# Patient Record
Sex: Male | Born: 1995 | Race: White | Hispanic: No | Marital: Single | Smoking: Current every day smoker
Health system: Southern US, Community
[De-identification: ages and names within clinical notes are randomized; demographics above are authoritative.]

---
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
## 2017-09-18 ENCOUNTER — Encounter (HOSPITAL_COMMUNITY): Payer: Self-pay | Admitting: Emergency Medicine

## 2017-09-18 ENCOUNTER — Emergency Department (HOSPITAL_COMMUNITY)
Admission: EM | Admit: 2017-09-18 | Discharge: 2017-09-19 | Disposition: A | Discharge status: Home / Self Care | Payer: Medicaid Other | Attending: Emergency Medicine | Admitting: Emergency Medicine

## 2017-09-18 DIAGNOSIS — F17210 Nicotine dependence, cigarettes, uncomplicated: Secondary | ICD-10-CM | POA: Diagnosis not present

## 2017-09-18 DIAGNOSIS — R51 Headache: Secondary | ICD-10-CM | POA: Insufficient documentation

## 2017-09-18 DIAGNOSIS — R590 Localized enlarged lymph nodes: Secondary | ICD-10-CM | POA: Diagnosis present

## 2017-09-18 DIAGNOSIS — R519 Headache, unspecified: Secondary | ICD-10-CM

## 2017-09-18 LAB — BASIC METABOLIC PANEL
Anion gap: 7 (ref 5–15)
BUN: 8 mg/dL (ref 6–20)
CO2: 30 mmol/L (ref 22–32)
Calcium: 9.8 mg/dL (ref 8.9–10.3)
Chloride: 106 mmol/L (ref 98–111)
Creatinine, Ser: 0.89 mg/dL (ref 0.61–1.24)
GFR calc Af Amer: >60 mL/min (ref >60)
GFR calc non Af Amer: >60 mL/min (ref >60)
Glucose, Bld: 103 mg/dL — ABNORMAL HIGH (ref 70–99)
POTASSIUM: 4.6 mmol/L (ref 3.5–5.1)
Sodium: 143 mmol/L (ref 135–145)

## 2017-09-18 LAB — CBC
HCT: 48 % (ref 39.0–52.0)
HEMOGLOBIN: 15.1 g/dL (ref 13.0–17.0)
MCH: 28.3 pg (ref 26.0–34.0)
MCHC: 31.5 g/dL (ref 30.0–36.0)
MCV: 90.1 fL (ref 78.0–100.0)
Platelets: 305 10*3/uL (ref 150–400)
RBC: 5.33 MIL/uL (ref 4.22–5.81)
RDW: 12.4 % (ref 11.5–15.5)
WBC: 9.8 10*3/uL (ref 4.0–10.5)
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
## 2017-09-19 ENCOUNTER — Emergency Department (HOSPITAL_COMMUNITY): Payer: Medicaid Other

## 2017-09-19 MED ORDER — METOCLOPRAMIDE HCL 5 MG/ML IJ SOLN
10.0000 mg | Freq: Once | INTRAMUSCULAR | Status: AC
  Administered 2017-09-19: 10 mg via INTRAVENOUS
  Filled 2017-09-19: qty 2

## 2017-09-19 MED ORDER — DIPHENHYDRAMINE HCL 50 MG/ML IJ SOLN
25.0000 mg | Freq: Once | INTRAMUSCULAR | Status: AC
  Administered 2017-09-19: 25 mg via INTRAVENOUS
  Filled 2017-09-19: qty 1

## 2017-09-19 MED ORDER — KETOROLAC TROMETHAMINE 30 MG/ML IJ SOLN
30.0000 mg | Freq: Once | INTRAMUSCULAR | Status: AC
  Administered 2017-09-19: 30 mg via INTRAVENOUS
  Filled 2017-09-19: qty 1

## 2017-09-19 NOTE — ED Notes (Signed)
Pt departed in NAD, refused use of wheelchair.  

## 2017-09-19 NOTE — Discharge Instructions (Addendum)
As we discussed, your headaches are probably migrainous.  Follow-up with the neurologist.  Take ibuprofen as needed.  Return to the ED if you develop new or worsening symptoms.
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
## 2020-04-01 ENCOUNTER — Encounter (HOSPITAL_COMMUNITY): Payer: Self-pay | Admitting: Emergency Medicine

## 2020-04-01 ENCOUNTER — Emergency Department (HOSPITAL_COMMUNITY): Payer: No Typology Code available for payment source

## 2020-04-01 ENCOUNTER — Emergency Department (HOSPITAL_COMMUNITY)
Admission: EM | Admit: 2020-04-01 | Discharge: 2020-04-01 | Disposition: A | Discharge status: Home / Self Care | Payer: No Typology Code available for payment source | Attending: Emergency Medicine | Admitting: Emergency Medicine

## 2020-04-01 ENCOUNTER — Other Ambulatory Visit: Payer: Self-pay

## 2020-04-01 DIAGNOSIS — S39012A Strain of muscle, fascia and tendon of lower back, initial encounter: Secondary | ICD-10-CM

## 2020-04-01 DIAGNOSIS — F17200 Nicotine dependence, unspecified, uncomplicated: Secondary | ICD-10-CM | POA: Insufficient documentation

## 2020-04-01 DIAGNOSIS — S0101XA Laceration without foreign body of scalp, initial encounter: Secondary | ICD-10-CM | POA: Diagnosis not present

## 2020-04-01 DIAGNOSIS — M542 Cervicalgia: Secondary | ICD-10-CM | POA: Insufficient documentation

## 2020-04-01 DIAGNOSIS — S29012A Strain of muscle and tendon of back wall of thorax, initial encounter: Secondary | ICD-10-CM | POA: Insufficient documentation

## 2020-04-01 DIAGNOSIS — S299XXA Unspecified injury of thorax, initial encounter: Secondary | ICD-10-CM | POA: Diagnosis present

## 2020-04-01 DIAGNOSIS — R519 Headache, unspecified: Secondary | ICD-10-CM | POA: Diagnosis not present

## 2020-04-01 MED ORDER — HYDROCODONE-ACETAMINOPHEN 5-325 MG PO TABS: 1.0000 | ORAL_TABLET | ORAL | 0 refills | Status: AC | PRN

## 2020-04-01 MED ORDER — HYDROCODONE-ACETAMINOPHEN 5-325 MG PO TABS
1.0000 | ORAL_TABLET | Freq: Once | ORAL | Status: AC
  Administered 2020-04-01: 1 via ORAL
  Filled 2020-04-01: qty 1

## 2020-04-01 NOTE — ED Notes (Addendum)
Patient in Xray, unable to medicate or reassess vitals
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
## 2020-04-01 NOTE — ED Notes (Signed)
Pt asking for arm to be bandaged - non-stick bandage applied

## 2020-04-01 NOTE — ED Notes (Signed)
Pt asking for socks

## 2020-04-01 NOTE — ED Notes (Signed)
Pt asking for note for work

## 2020-04-01 NOTE — ED Notes (Signed)
Pt asking for something to wear home, paper scrubs provided

## 2020-04-01 NOTE — ED Notes (Signed)
Patient to CT.
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
## 2020-04-01 NOTE — Discharge Instructions (Addendum)
Have the staple removed in 10-14 days.

## 2020-07-22 ENCOUNTER — Encounter (HOSPITAL_COMMUNITY): Payer: Self-pay | Admitting: Emergency Medicine

## 2020-07-22 ENCOUNTER — Emergency Department (HOSPITAL_COMMUNITY)
Admission: EM | Admit: 2020-07-22 | Discharge: 2020-07-22 | Disposition: A | Discharge status: Home / Self Care | Payer: Self-pay | Attending: Emergency Medicine | Admitting: Emergency Medicine

## 2020-07-22 ENCOUNTER — Other Ambulatory Visit: Payer: Self-pay

## 2020-07-22 DIAGNOSIS — K0889 Other specified disorders of teeth and supporting structures: Secondary | ICD-10-CM | POA: Insufficient documentation

## 2020-07-22 DIAGNOSIS — F17200 Nicotine dependence, unspecified, uncomplicated: Secondary | ICD-10-CM | POA: Insufficient documentation

## 2020-07-22 MED ORDER — PENICILLIN V POTASSIUM 250 MG PO TABS
500.0000 mg | ORAL_TABLET | Freq: Once | ORAL | Status: AC
  Administered 2020-07-22: 500 mg via ORAL
  Filled 2020-07-22: qty 2

## 2020-07-22 MED ORDER — PENICILLIN V POTASSIUM 500 MG PO TABS: 500.0000 mg | ORAL_TABLET | Freq: Four times a day (QID) | ORAL | 0 refills | Status: AC

## 2020-07-22 MED ORDER — KETOROLAC TROMETHAMINE 30 MG/ML IJ SOLN
30.0000 mg | Freq: Once | INTRAMUSCULAR | Status: AC
  Administered 2020-07-22: 30 mg via INTRAMUSCULAR
  Filled 2020-07-22: qty 1

## 2020-07-22 NOTE — ED Provider Notes (Signed)
[facility] EMERGENCY DEPARTMENT Provider Note   CSN: [id number] Arrival date & time: 07/22/20  0457     History   Chief Complaint No chief complaint on file.   HPI Kent Lee is a 25 y.o. male.  Patient presents to the emergency department with a dental complaint. Symptoms began 8 hours ago. The patient has tried to alleviate pain with Tylenol.  Pain rated as severe, characterized as throbbing in nature and located right upper molars. Patient denies fever, night sweats, chills, difficulty swallowing or opening mouth, SOB, nuchal rigidity or decreased ROM of neck.  Patient does not have a dentist and requests a resource guide at discharge.      HPI  No past medical history on file.  There are no problems to display for this patient.   No past surgical history on file.      Home Medications    Prior to Admission medications   Medication Sig Start Date End Date Taking? Authorizing Provider  HYDROcodone-acetaminophen (NORCO/VICODIN) 5-325 MG tablet Take 1 tablet by mouth every 4 (four) hours as needed. 04/01/20   Niel Hummer, MD  ibuprofen (ADVIL,MOTRIN) 600 MG tablet Take 1 tablet (600 mg total) by mouth every 6 (six) hours as needed for mild pain. 09/13/14   Marcellina Millin, MD    Family History No family history on file.  Social History Social History   Tobacco Use  . Smoking status: Current Every Day Smoker    Packs/day: 0.25  . Smokeless tobacco: Never Used  Substance Use Topics  . Alcohol use: Yes  . Drug use: Yes    Types: Marijuana     Allergies   Patient has no known allergies.   Review of Systems Review of Systems  Constitutional: Negative for chills and fever.  HENT: Positive for dental problem. Negative for drooling.   Neurological: Negative for speech difficulty.  Psychiatric/Behavioral: Positive for sleep disturbance.     Physical Exam Updated Vital Signs BP (!) 136/95 (BP Location: Right Arm)   Pulse 85   Temp  98.3 F (36.8 C) (Oral)   Resp (!) 22   SpO2 97%   Physical Exam Physical Exam  Constitutional: Pt appears well-developed and well-nourished.  HENT:  Head: Normocephalic.  Right Ear: Tympanic membrane, external ear and ear canal normal.  Left Ear: Tympanic membrane, external ear and ear canal normal.  Nose: Nose normal. Right sinus exhibits no maxillary sinus tenderness and no frontal sinus tenderness. Left sinus exhibits no maxillary sinus tenderness and no frontal sinus tenderness.  Mouth/Throat: Uvula is midline, oropharynx is clear and moist and mucous membranes are normal. No oral lesions. No uvula swelling or lacerations. No oropharyngeal exudate, posterior oropharyngeal edema, posterior oropharyngeal erythema or tonsillar abscesses.  Poor dentition No gingival swelling, fluctuance or induration No gross abscess  No sublingual edema, tenderness to palpation, or sign of Ludwig's angina, or deep space infection Pain at right upper molars Eyes: Conjunctivae are normal. Pupils are equal, round, and reactive to light. Right eye exhibits no discharge. Left eye exhibits no discharge.  Neck: Normal range of motion. Neck supple.  No stridor Handling secretions without difficulty No nuchal rigidity No cervical lymphadenopathy Cardiovascular: Normal rate, regular rhythm and normal heart sounds.   Pulmonary/Chest: Effort normal. No respiratory distress.  Equal chest rise  Abdominal: Soft. Bowel sounds are normal. Pt exhibits no distension. There is no tenderness.  Lymphadenopathy: Pt has no cervical adenopathy.  Neurological: Pt is alert and oriented x 4  Skin: Skin is warm and dry.  Psychiatric: Pt has a normal mood and affect.  Nursing note and vitals reviewed.   ED Treatments / Results  Labs (all labs ordered are listed, but only abnormal results are displayed) Labs Reviewed - No data to display  EKG    Radiology No results found.  Procedures Procedures (including  critical care time)  Medications Ordered in ED Medications - No data to display   Initial Impression / Assessment and Plan / ED Course  I have reviewed the triage vital signs and the nursing notes.  Pertinent labs & imaging results that were available during my care of the patient were reviewed by me and considered in my medical decision making (see chart for details).        Patient with dentalgia.  No abscess requiring immediate incision and drainage.  Exam not concerning for Ludwig's angina or pharyngeal abscess.  Will treat with penicillin. Pt instructed to follow-up with dentist.  Discussed return precautions. Pt safe for discharge.   Final Clinical Impressions(s) / ED Diagnoses   Final diagnoses:  Pain, dental    ED Discharge Orders    None       Roxy Horseman, PA-C 07/22/20 0515    Sabas Sous, MD 07/22/20 [phone]

## 2020-07-22 NOTE — ED Triage Notes (Signed)
Patient here with dental pain.  Crying in triage
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
